# Patient Record
Sex: Female | Born: 2005 | Race: Black or African American | Hispanic: No | Marital: Single | Smoking: Never smoker
Health system: Southern US, Community
[De-identification: ages and names within clinical notes are randomized; demographics above are authoritative.]

## PROBLEM LIST (undated history)

## (undated) DIAGNOSIS — J45909 Unspecified asthma, uncomplicated: Secondary | ICD-10-CM

## (undated) DIAGNOSIS — F812 Mathematics disorder: Secondary | ICD-10-CM

## (undated) DIAGNOSIS — F429 Obsessive-compulsive disorder, unspecified: Secondary | ICD-10-CM

## (undated) DIAGNOSIS — R0683 Snoring: Secondary | ICD-10-CM

## (undated) DIAGNOSIS — F819 Developmental disorder of scholastic skills, unspecified: Secondary | ICD-10-CM

## (undated) DIAGNOSIS — H5213 Myopia, bilateral: Secondary | ICD-10-CM

## (undated) HISTORY — DX: Myopia, bilateral: H52.13

## (undated) HISTORY — DX: Unspecified asthma, uncomplicated: J45.909

## (undated) HISTORY — DX: Mathematics disorder: F81.2

## (undated) HISTORY — DX: Obsessive-compulsive disorder, unspecified: F42.9

## (undated) HISTORY — DX: Snoring: R06.83

## (undated) HISTORY — DX: Developmental disorder of scholastic skills, unspecified: F81.9

---
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
## 2024-01-27 ENCOUNTER — Other Ambulatory Visit: Payer: Self-pay | Admitting: Nurse Practitioner

## 2024-01-27 DIAGNOSIS — J4520 Mild intermittent asthma, uncomplicated: Secondary | ICD-10-CM

## 2024-01-29 NOTE — Telephone Encounter (Signed)
 Requested Prescriptions  Pending Prescriptions Disp Refills   SYMBICORT  160-4.5 MCG/ACT inhaler [Pharmacy Med Name: SYMBICORT  160-4.5 MCG INHALER] 30.6 each 0    Sig: TAKE 2 PUFFS BY MOUTH TWICE A DAY     Pulmonology:  Combination Products Passed - 01/29/2024  1:48 PM      Passed - Valid encounter within last 12 months    Recent Outpatient Visits           2 months ago MDD (major depressive disorder), recurrent episode, mild   [place] [facility] [place], Dorette, MD   4 months ago Perennial allergic rhinitis with seasonal variation   [place] [facility] [place] Dorette, MD   6 months ago Well adult exam   [facility] [place] Dorette, MD       Future Appointments             In 5 months [place], Krichna, MD [facility], [place]

## 2024-07-24 ENCOUNTER — Encounter: Admitting: Family Medicine

## 2024-08-07 ENCOUNTER — Encounter: Admitting: Family Medicine
